# Patient Record
Sex: Female | Born: 1950 | Race: White | Hispanic: No | Marital: Single | Smoking: Former smoker
Health system: Southern US, Community
[De-identification: ages and names within clinical notes are randomized; demographics above are authoritative.]

## PROBLEM LIST (undated history)

## (undated) ENCOUNTER — Emergency Department (HOSPITAL_BASED_OUTPATIENT_CLINIC_OR_DEPARTMENT_OTHER): Discharge status: Absent without leave | Payer: Self-pay

## (undated) DIAGNOSIS — F419 Anxiety disorder, unspecified: Secondary | ICD-10-CM

## (undated) DIAGNOSIS — K219 Gastro-esophageal reflux disease without esophagitis: Secondary | ICD-10-CM

## (undated) DIAGNOSIS — I4891 Unspecified atrial fibrillation: Secondary | ICD-10-CM

## (undated) DIAGNOSIS — R200 Anesthesia of skin: Secondary | ICD-10-CM

## (undated) DIAGNOSIS — E162 Hypoglycemia, unspecified: Secondary | ICD-10-CM

## (undated) DIAGNOSIS — F329 Major depressive disorder, single episode, unspecified: Secondary | ICD-10-CM

## (undated) DIAGNOSIS — F32A Depression, unspecified: Secondary | ICD-10-CM

## (undated) DIAGNOSIS — M1990 Unspecified osteoarthritis, unspecified site: Secondary | ICD-10-CM

## (undated) DIAGNOSIS — R002 Palpitations: Secondary | ICD-10-CM

## (undated) HISTORY — DX: Hypoglycemia, unspecified: E16.2

## (undated) HISTORY — DX: Depression, unspecified: F32.A

## (undated) HISTORY — PX: WISDOM TOOTH EXTRACTION: SHX21

## (undated) HISTORY — DX: Unspecified atrial fibrillation: I48.91

## (undated) HISTORY — DX: Anesthesia of skin: R20.0

## (undated) HISTORY — DX: Major depressive disorder, single episode, unspecified: F32.9

## (undated) HISTORY — DX: Palpitations: R00.2

## (undated) HISTORY — DX: Gastro-esophageal reflux disease without esophagitis: K21.9

## (undated) HISTORY — DX: Unspecified osteoarthritis, unspecified site: M19.90

## (undated) HISTORY — DX: Anxiety disorder, unspecified: F41.9

## (undated) HISTORY — PX: NOSE SURGERY: SHX723

---

## 1990-09-11 HISTORY — PX: ABDOMINAL HYSTERECTOMY: SHX81

## 2012-07-25 ENCOUNTER — Encounter: Payer: Self-pay | Admitting: Obstetrics and Gynecology

## 2012-09-25 ENCOUNTER — Ambulatory Visit (INDEPENDENT_AMBULATORY_CARE_PROVIDER_SITE_OTHER): Payer: Self-pay | Admitting: Obstetrics & Gynecology

## 2012-09-25 ENCOUNTER — Encounter: Payer: Self-pay | Admitting: Obstetrics & Gynecology

## 2012-09-25 VITALS — BP 143/87 | HR 68 | Temp 97.5°F | Resp 20 | Ht 62.0 in | Wt 158.2 lb

## 2012-09-25 DIAGNOSIS — N898 Other specified noninflammatory disorders of vagina: Secondary | ICD-10-CM

## 2012-09-25 NOTE — Progress Notes (Signed)
Pt sent from [facility] due to a "knot" in my vagina.  Pt also c/o having hot flashes.

## 2012-09-25 NOTE — Progress Notes (Signed)
  Subjective:    Patient ID: Tammy Bartlett, female    DOB: 11/02/1950, 62 y.o.   MRN: [id number]  HPI  62 yo abstinent lady referred by [facility] for evaluation of a 1 year h/o a "knot", "painful" in her posterior vagina. She was told that it is a "lymph node". She has occasional GSUI. She also complains of hot flashes "since I was in my 20s". She took ERT from age [age].   Review of Systems     Objective:   Physical Exam  Moderate/severe vaginal atrophy A small rectocele, almost like a diverticulum, about 1.5 cm in from the introitus on the posterior vaginal vault A second opinion by Dr. Burnice Bartlett- Tammy Bartlett was obtained and she believes that this is a vaginal wall cyst.     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 RTC prn

## 2014-07-13 ENCOUNTER — Encounter: Payer: Self-pay | Admitting: Obstetrics & Gynecology

## 2017-11-16 ENCOUNTER — Encounter: Payer: Self-pay | Admitting: *Deleted

## 2017-11-16 ENCOUNTER — Other Ambulatory Visit: Payer: Self-pay | Admitting: *Deleted

## 2017-11-16 NOTE — Patient Outreach (Signed)
[facility] Care Management  11/16/2017  Charlestine MassedRose Mitchell 11/25/1950 [id number]  Referral via [employer]; [facility]:  Telephone call to patient; recording states service restricted or unavailable; unable to leave message.  Plan: Furniture conservator/restorerend outreach letter. Follow up 2-5 business days.  Colleen CanLinda Shelbee Apgar, RN BSN CCM Care Management Coordinator [facility]  [phone]

## 2017-11-19 ENCOUNTER — Other Ambulatory Visit: Payer: Self-pay | Admitting: *Deleted

## 2017-11-19 NOTE — Patient Outreach (Signed)
[facility] Care Management  11/19/2017  Tammy Bartlett September 26, 1950 [id number]  Notified by care management assistant that [facility]. Case has been closed out.  Plan: Case closed out.   Tammy CanLinda Mordechai Matuszak, RN BSN CCM Care Management Coordinator [facility]  [phone]

## 2017-11-21 ENCOUNTER — Other Ambulatory Visit: Payer: Self-pay | Admitting: *Deleted

## 2017-11-21 NOTE — Patient Outreach (Signed)
[facility]) Care Management  11/21/2017  Tammy MassedRose Bartlett 18-Jan-1951 [id number]  [facility]. Case has been closed out.   Colleen CanLinda Loana Salvaggio, RN BSN CCM Care Management Coordinator [facility]  [phone]

## 2018-03-23 ENCOUNTER — Other Ambulatory Visit (HOSPITAL_BASED_OUTPATIENT_CLINIC_OR_DEPARTMENT_OTHER): Payer: Self-pay | Admitting: *Deleted

## 2018-03-23 ENCOUNTER — Ambulatory Visit (HOSPITAL_BASED_OUTPATIENT_CLINIC_OR_DEPARTMENT_OTHER)
Admission: RE | Admit: 2018-03-23 | Discharge: 2018-03-23 | Disposition: A | Discharge status: Home / Self Care | Payer: Medicare HMO | Source: Ambulatory Visit | Attending: Diagnostic Radiology | Admitting: Diagnostic Radiology

## 2018-03-23 DIAGNOSIS — M79662 Pain in left lower leg: Secondary | ICD-10-CM | POA: Diagnosis present

## 2018-03-23 DIAGNOSIS — M7122 Synovial cyst of popliteal space [Baker], left knee: Secondary | ICD-10-CM | POA: Diagnosis not present

## 2018-03-23 DIAGNOSIS — M79605 Pain in left leg: Secondary | ICD-10-CM

## 2018-07-23 ENCOUNTER — Other Ambulatory Visit: Payer: Self-pay

## 2019-12-22 IMAGING — US US EXTREM LOW VENOUS*L*
1 series · 13 of 24 positions shown · non-contrast
Comparison: None.

CLINICAL DATA: Left lower extremity swelling and pain for 1 month.



[Series 1: us extrem low venous*left* · 0.08mm/px · 13 of 47 slices shown]
[im 1/47]
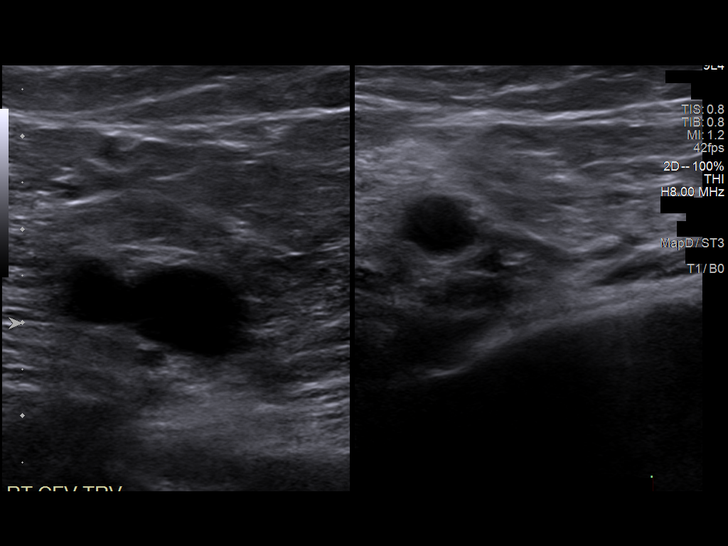
[im 5/47]
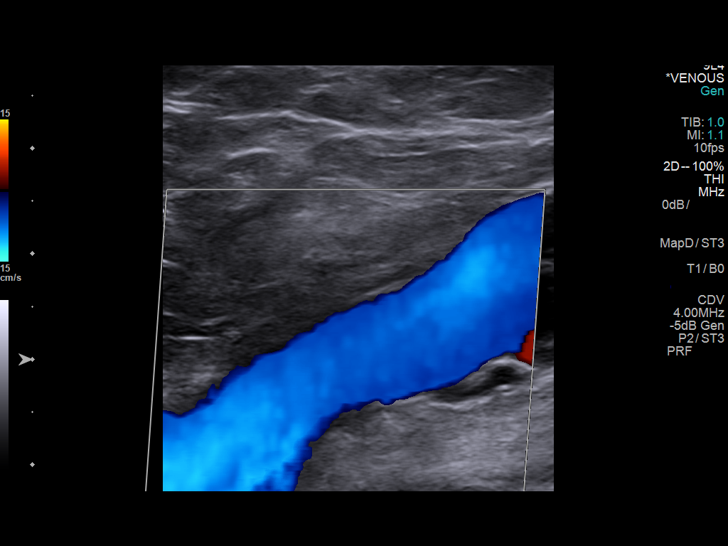
[im 9/47]
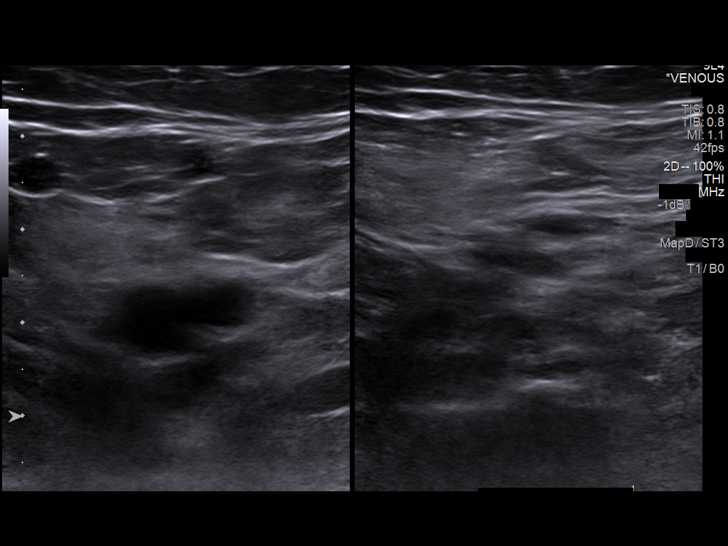
[im 13/47]
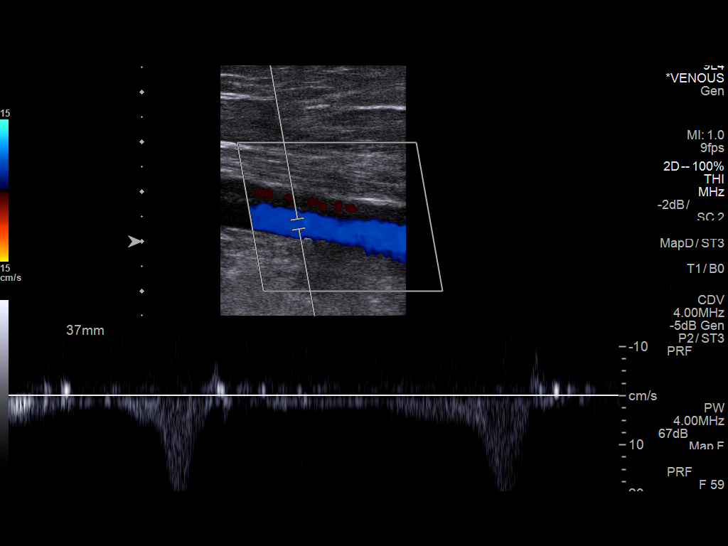
[im 17/47]
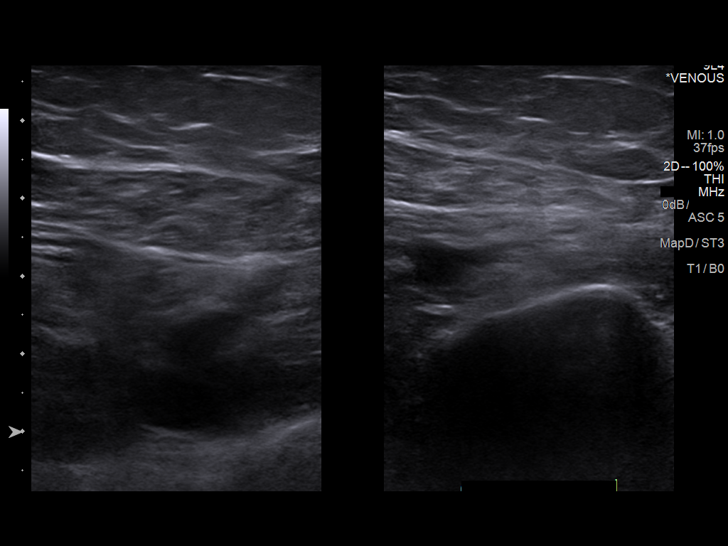
[im 21/47]
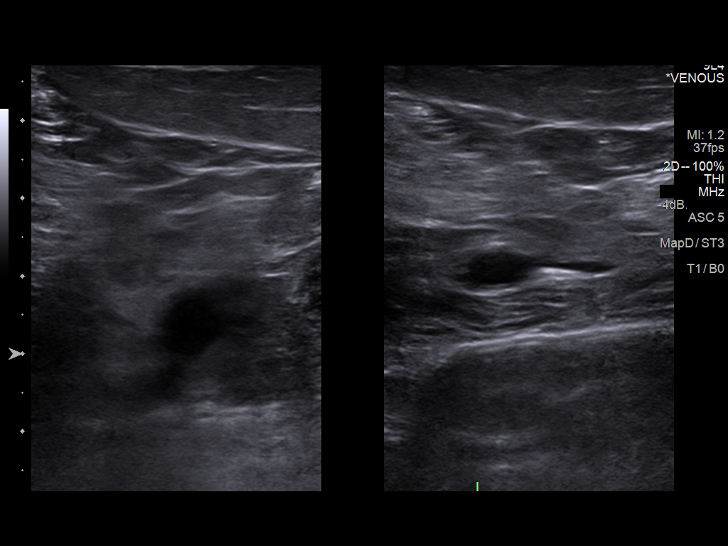
[im 25/47]
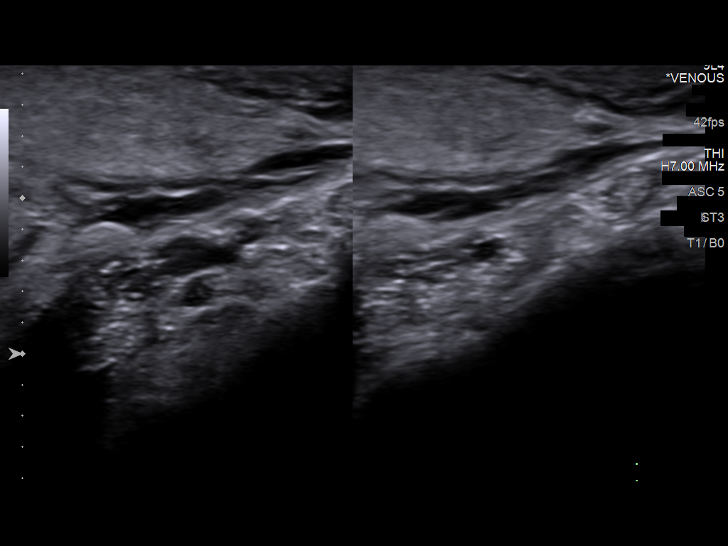
[im 27/47]
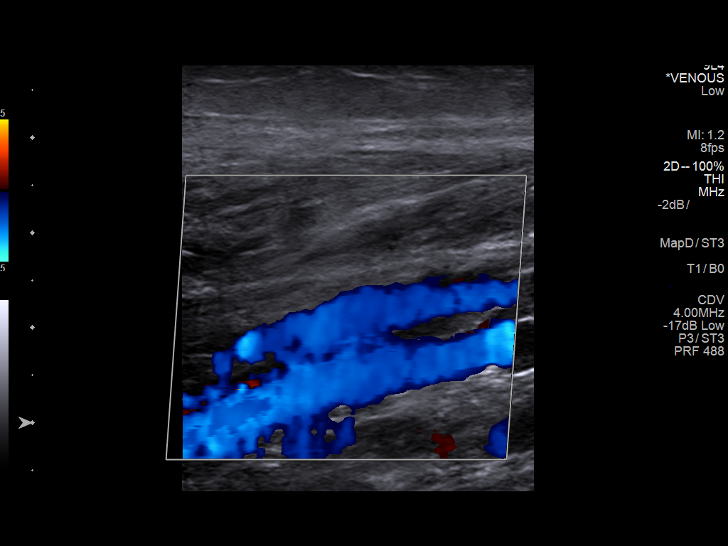
[im 31/47]
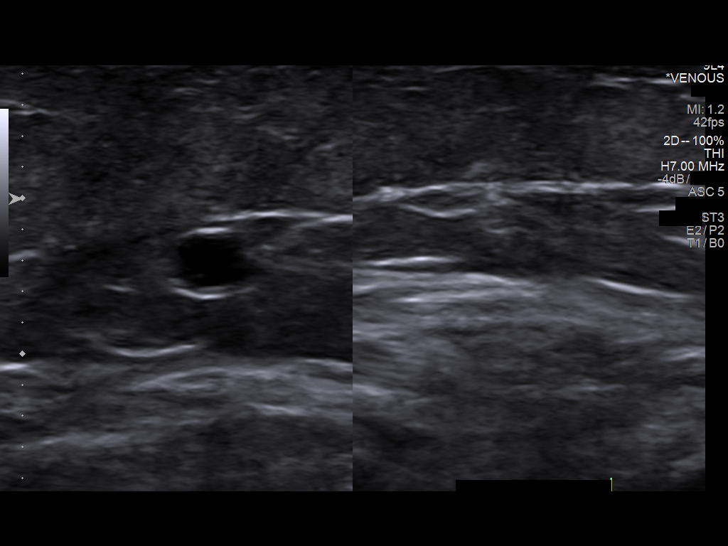
[im 35/47]
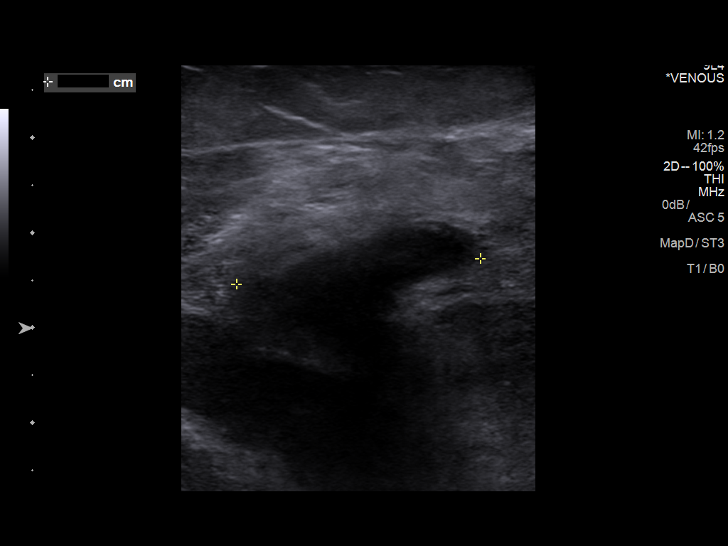
[im 39/47]
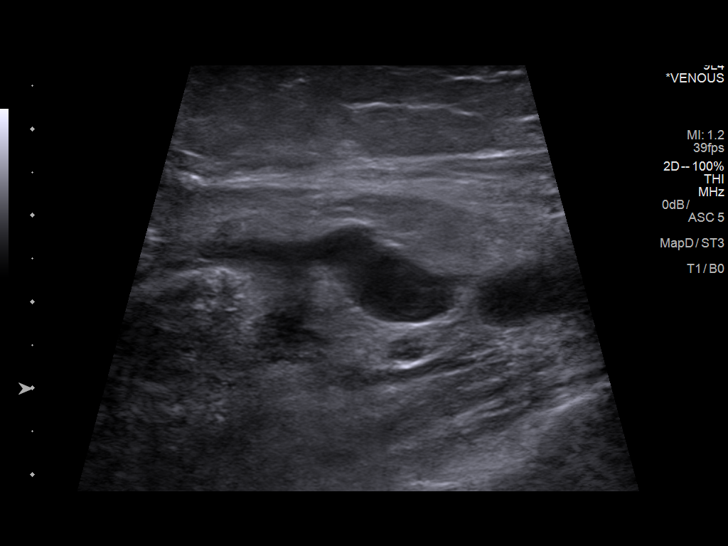
[im 43/47]
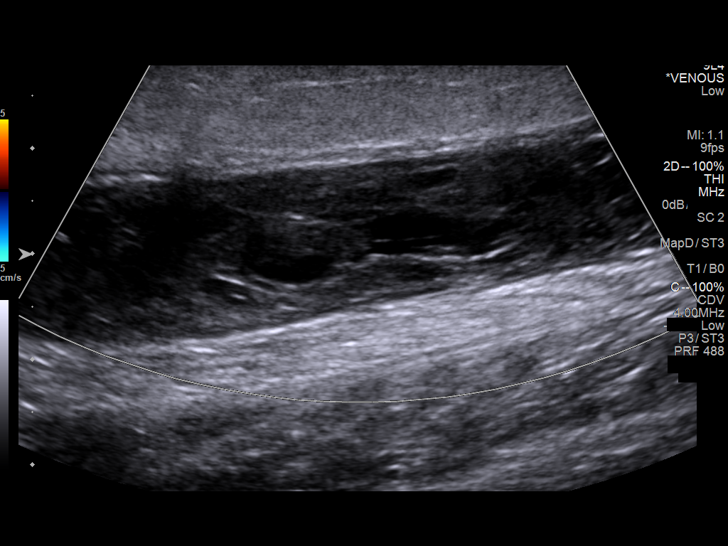
[im 47/47]
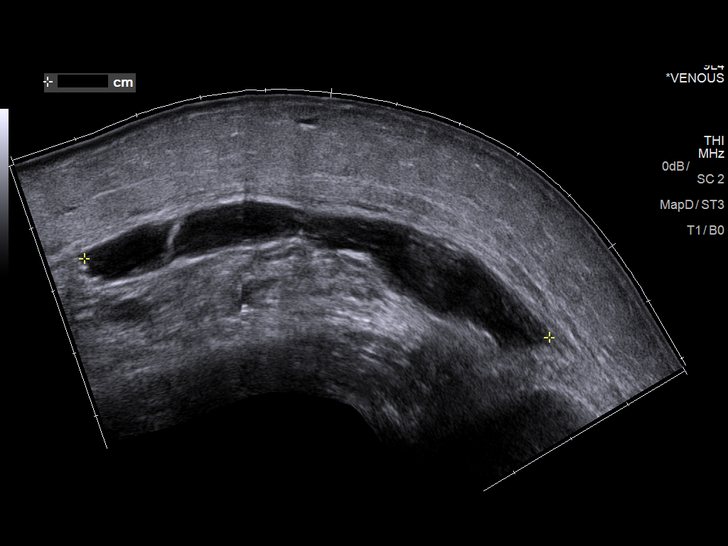

[13 of 24 positions shown; findings below may reference images not displayed]

FINDINGS: Contralateral Common Femoral Vein: Respiratory phasicity is normal
and symmetric with the symptomatic side. No evidence of thrombus.
Normal compressibility.

Common Femoral Vein: No evidence of thrombus. Normal
compressibility, respiratory phasicity and response to augmentation.

Saphenofemoral Junction: No evidence of thrombus. Normal
compressibility and flow on color Doppler imaging.

Profunda Femoral Vein: No evidence of thrombus. Normal
compressibility and flow on color Doppler imaging.

Femoral Vein: No evidence of thrombus. Normal compressibility,
respiratory phasicity and response to augmentation.

Popliteal Vein: No evidence of thrombus. Normal compressibility,
respiratory phasicity and response to augmentation.

Calf Veins: No evidence of thrombus. Normal compressibility and flow
on color Doppler imaging.

Superficial Great Saphenous Vein: No evidence of thrombus. Normal
compressibility.

Venous Reflux:  None.

Other Findings: There is a complex fluid collection within the
popliteal fossa measuring 17.7 x 1.5 x 7.1 cm.
IMPRESSION: 1. No evidence for deep vein thrombosis.
2. Large complex Baker's cyst is identified within the left
popliteal fossa.

## 2023-05-22 ENCOUNTER — Other Ambulatory Visit: Payer: Self-pay | Admitting: Physician Assistant

## 2023-05-22 DIAGNOSIS — M546 Pain in thoracic spine: Secondary | ICD-10-CM

## 2023-05-22 DIAGNOSIS — M800AXA Age-related osteoporosis with current pathological fracture, other site, initial encounter for fracture: Secondary | ICD-10-CM

## 2023-05-22 DIAGNOSIS — M40209 Unspecified kyphosis, site unspecified: Secondary | ICD-10-CM

## 2023-05-22 DIAGNOSIS — S22050A Wedge compression fracture of T5-T6 vertebra, initial encounter for closed fracture: Secondary | ICD-10-CM

## 2023-06-10 ENCOUNTER — Ambulatory Visit
Admission: RE | Admit: 2023-06-10 | Discharge: 2023-06-10 | Disposition: A | Discharge status: Home / Self Care | Payer: Medicare HMO | Source: Ambulatory Visit | Attending: Physician Assistant | Admitting: Physician Assistant

## 2023-06-10 DIAGNOSIS — M800AXA Age-related osteoporosis with current pathological fracture, other site, initial encounter for fracture: Secondary | ICD-10-CM

## 2023-06-10 DIAGNOSIS — S22050A Wedge compression fracture of T5-T6 vertebra, initial encounter for closed fracture: Secondary | ICD-10-CM

## 2023-06-10 DIAGNOSIS — M546 Pain in thoracic spine: Secondary | ICD-10-CM

## 2023-06-10 DIAGNOSIS — M40209 Unspecified kyphosis, site unspecified: Secondary | ICD-10-CM
